# Patient Record
Sex: Female | Born: 1960 | Race: White | Hispanic: No | Marital: Single
Health system: Southern US, Community
[De-identification: ages and names within clinical notes are randomized; demographics above are authoritative.]

---

## 1998-11-29 ENCOUNTER — Other Ambulatory Visit: Admission: RE | Admit: 1998-11-29 | Discharge: 1998-11-29 | Payer: Self-pay | Admitting: Obstetrics and Gynecology

## 2000-02-16 ENCOUNTER — Other Ambulatory Visit: Admission: RE | Admit: 2000-02-16 | Discharge: 2000-02-16 | Payer: Self-pay | Admitting: *Deleted

## 2001-07-24 ENCOUNTER — Other Ambulatory Visit: Admission: RE | Admit: 2001-07-24 | Discharge: 2001-07-24 | Payer: Self-pay | Admitting: Obstetrics and Gynecology

## 2001-11-19 ENCOUNTER — Encounter: Payer: Self-pay | Admitting: Vascular Surgery

## 2001-11-20 ENCOUNTER — Ambulatory Visit (HOSPITAL_COMMUNITY): Admission: RE | Admit: 2001-11-20 | Discharge: 2001-11-20 | Payer: Self-pay | Admitting: Vascular Surgery

## 2001-11-20 ENCOUNTER — Encounter (INDEPENDENT_AMBULATORY_CARE_PROVIDER_SITE_OTHER): Payer: Self-pay | Admitting: *Deleted

## 2002-05-06 ENCOUNTER — Ambulatory Visit (HOSPITAL_COMMUNITY): Admission: RE | Admit: 2002-05-06 | Discharge: 2002-05-06 | Payer: Self-pay | Admitting: Family Medicine

## 2002-05-06 ENCOUNTER — Encounter: Payer: Self-pay | Admitting: Family Medicine

## 2002-10-02 ENCOUNTER — Other Ambulatory Visit: Admission: RE | Admit: 2002-10-02 | Discharge: 2002-10-02 | Payer: Self-pay | Admitting: Obstetrics and Gynecology

## 2003-04-01 ENCOUNTER — Ambulatory Visit (HOSPITAL_COMMUNITY): Admission: RE | Admit: 2003-04-01 | Discharge: 2003-04-01 | Payer: Self-pay | Admitting: Vascular Surgery

## 2003-04-01 ENCOUNTER — Encounter (INDEPENDENT_AMBULATORY_CARE_PROVIDER_SITE_OTHER): Payer: Self-pay | Admitting: *Deleted

## 2008-06-11 ENCOUNTER — Encounter: Admission: RE | Admit: 2008-06-11 | Discharge: 2008-06-11 | Payer: Self-pay | Admitting: Family Medicine

## 2009-11-16 IMAGING — CR DG TIBIA/FIBULA 2V*L*
2 series · 2 of 2 positions shown · non-contrast
Comparison: None

CLINICAL DATA: Injured with a dog leash a week ago with pain

LEFT TIBIA AND FIBULA - 2 VIEW

[view not recorded (1 of 2)]
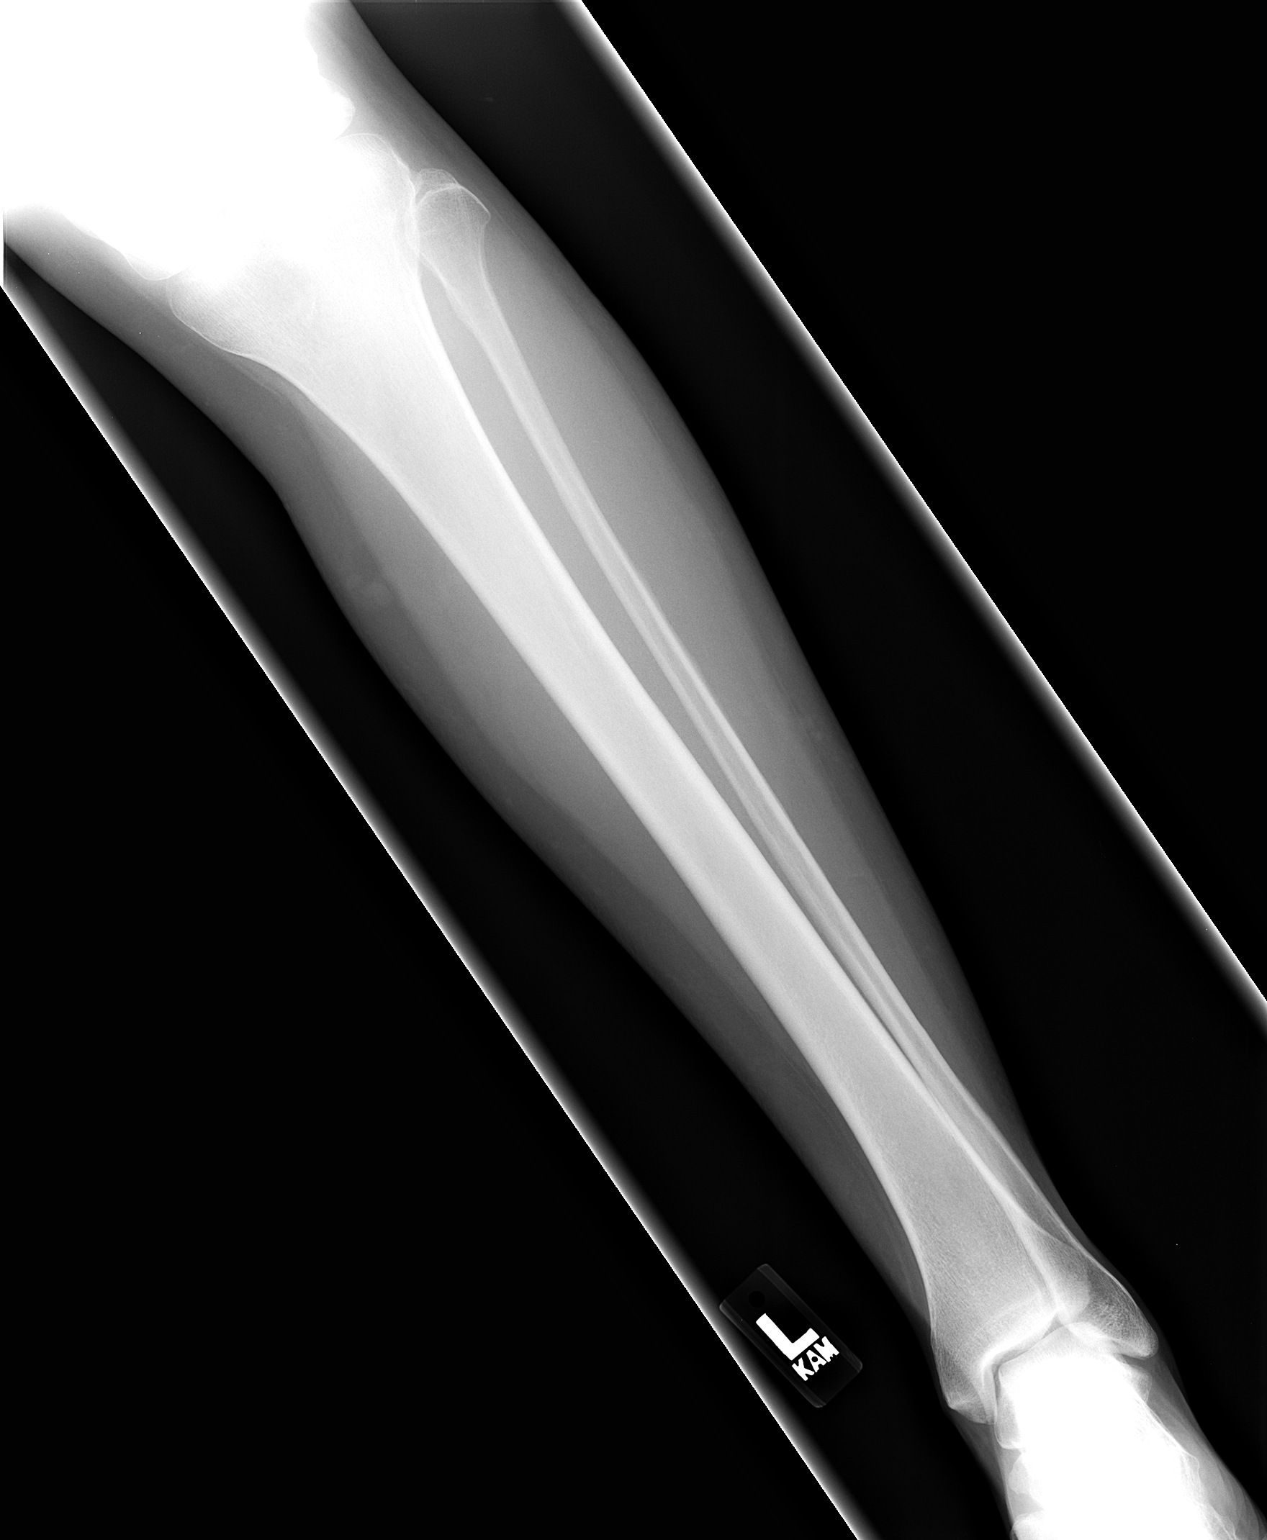

[view not recorded (2 of 2)]
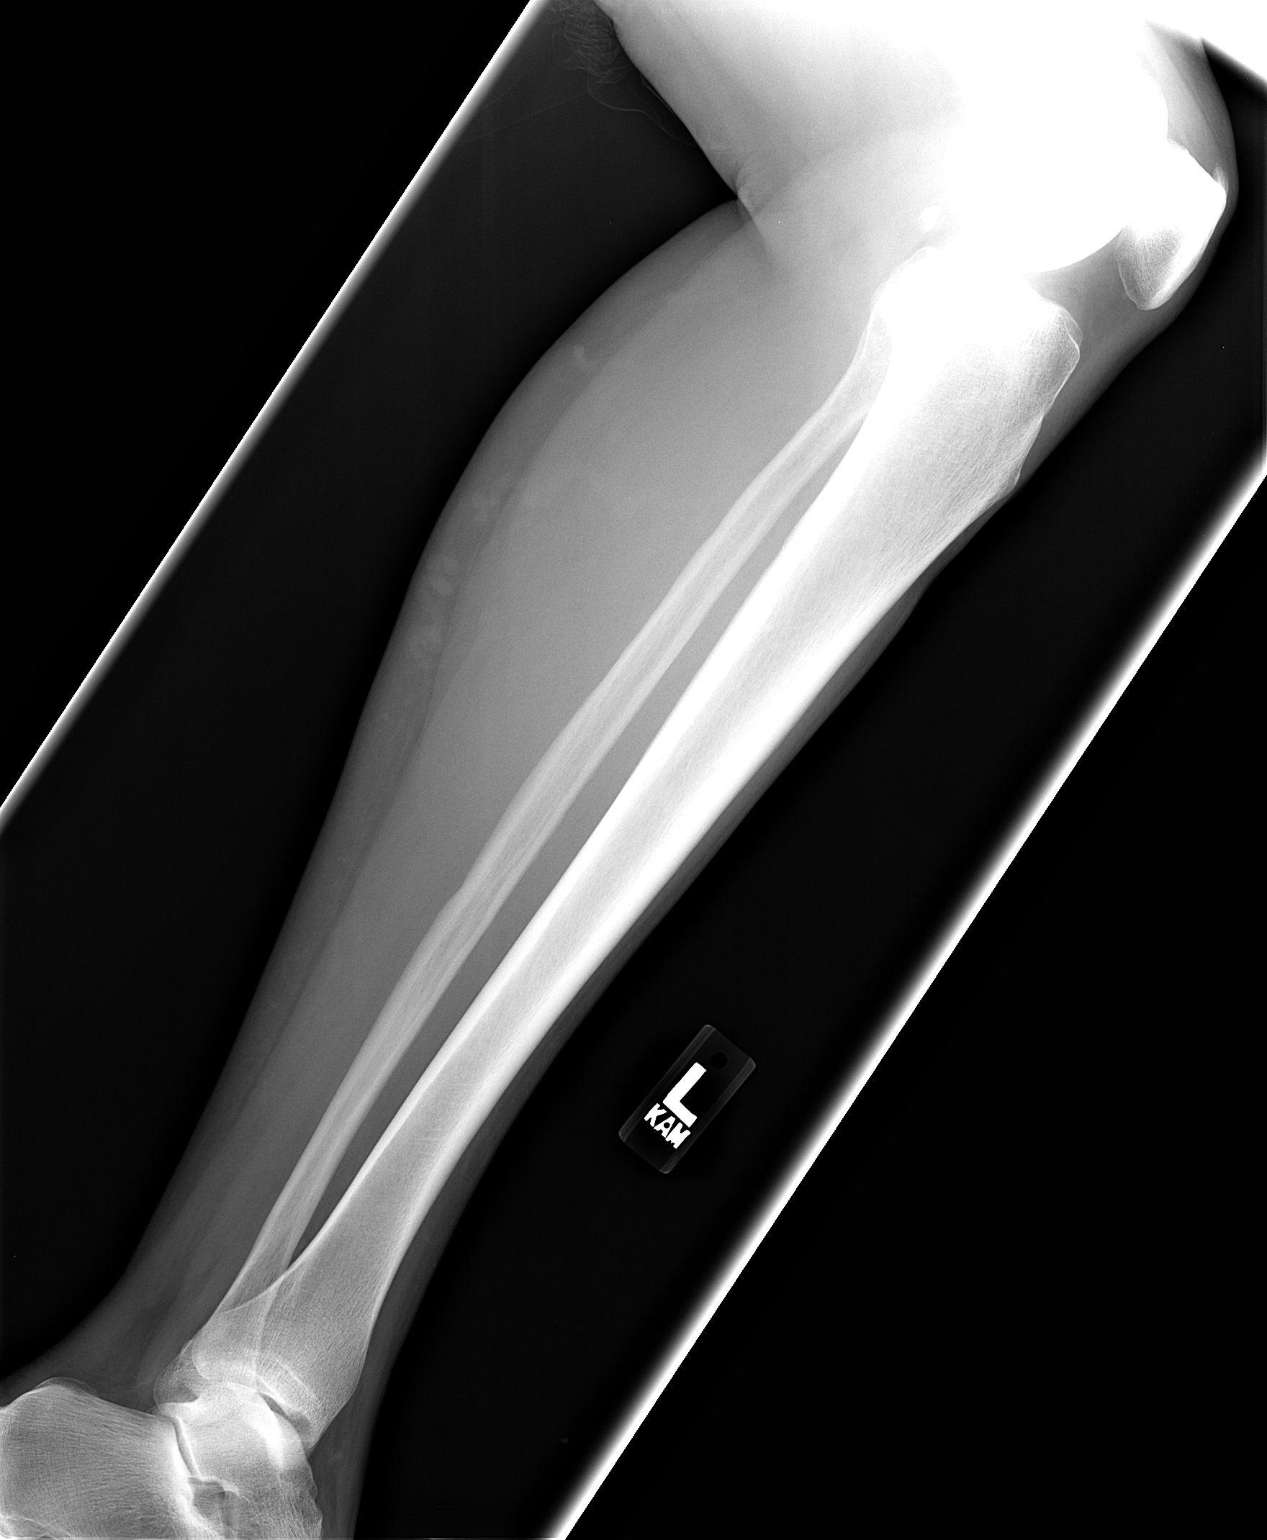

[2 of 2 positions shown; findings below may reference images not displayed]

FINDINGS: No bony abnormality is seen.  Alignment is normal.  No
foreign body is seen and no soft tissue swelling is noted.
IMPRESSION: Negative left tib-fib.

## 2010-11-25 NOTE — Op Note (Signed)
[place]  Patient:    Melanie Ferguson, Melanie Ferguson Visit Number: [id number] MRN: [id number]          Service Type: Attending:  Quita Skye. Hart Rochester, M.D. Dictated by:   Quita Skye Hart Rochester, M.D. Proc. Date: 11/20/01                             Operative Report  PREOPERATIVE DIAGNOSIS:  Severe varicose veins, greater saphenous system, left leg.  POSTOPERATIVE DIAGNOSIS:  Severe varicose veins, greater saphenous system, left leg.  OPERATION: 1. Ligation and stripping of greater saphenous vein, left leg. 2. Excision of multiple varicosities using the Trivex system, left leg.  SURGEON:  Quita Skye. Hart Rochester, M.D.  FIRST ASSISTANT:  Loura Pardon, P.A.  ANESTHESIA:  General endotracheal.  DESCRIPTION OF PROCEDURE:  Patient was taken to the operating room and placed in the upright position, at which time the varicosities in the left leg were marked.  These were mainly at the knee distally, both posteriorly and anteriorly into the pretibial region and posterior calf.  Patient was returned into the Trendelenburg position and satisfactory endotracheal anesthesia was administered.  After prepping and draping the left leg with Betadine scrubbing solution and draped in a sterile manner, an incision was made anterior to the medial malleolus, greater saphenous vein carefully dissected free from the saphenous nerve, ligated distally and the intraluminal stripper passed proximally, where it was palpated at the saphenofemoral junction.  A short oblique incision was made, saphenous vein dissected free, its branches ligated with 3-0 silk ties and it was ligated with a 2-0 silk tie at the saphenofemoral junction.  The vein was transected and the smaller stripper head secured with a 2-0 silk tie.  Following this, the Trivex device was utilized to remove the varicosities below the knee using small stab wounds for the infusion and transillumination port and separate small stab wounds  for the resector; a total of about six incisions were made.  When this was completed, the tunnel was thoroughly irrigated with saline and the leg placed in a Trendelenburg position, compression applied with an Ace wrap and the greater saphenous vein stripped from proximal to distal and 10 minutes of compression applied for hemostasis.  The proximal and distal wounds were closed with Vicryl in a subcuticular fashion.  The small stab wounds were then closed with Steri-Strips and Benzoin, sterile dressing applied consisting of 4 x 4s, Webril and Ace and the patient taken to the recovery room in satisfactory condition. Dictated by:   Quita Skye Hart Rochester, M.D. Attending:  Quita Skye. Hart Rochester, M.D. DD:  11/20/01 TD:  11/21/01 Job: [id number]

## 2010-11-25 NOTE — Op Note (Signed)
   Melanie Ferguson, Melanie Ferguson                        ACCOUNT NO.:  [id number]   MEDICAL RECORD NO.:  [id number]                   PATIENT TYPE:  OIB   LOCATION:  2867                                 FACILITY:  [facility]   PHYSICIAN:  Quita Skye. Hart Rochester, M.D.               DATE OF BIRTH:  [birth year]   DATE OF PROCEDURE:  DATE OF DISCHARGE:  04/01/2003                                 OPERATIVE REPORT   PREOPERATIVE DIAGNOSIS:  Varicose veins, greater saphenous system, right  leg.   POSTOPERATIVE DIAGNOSIS:  Varicose veins, greater saphenous system, right  leg.   OPERATION PERFORMED:  1. Ligation and stripping, greater saphenous vein, right leg.  2. Excision of multiple varicosities, right leg.   SURGEON:  Quita Skye. Hart Rochester, M.D.   ASSISTANT:  Nurse.   ANESTHESIA:  General endotracheal.   DESCRIPTION OF PROCEDURE:  The patient was taken to the operating room and  placed in the upright position at which the varicosities in the right leg  were marked.  These were in the very distal thigh and proximal calf  medially, extending posteriorly to the popliteal fossa.  The patient was  placed in the supine position.  Satisfactory general endotracheal anesthesia  administered. The right leg was prepped with Betadine solution and draped in  routine sterile manner.  An oblique incision was made in the inguinal  crease, saphenofemoral junction dissected free.  Branches ligated with 3-0  silk ties and divided.  The saphenous vein was ligated at the femoral vein  level and an intraluminal stripper passed from proximal to distal to the  proximal calf region in the area of the nest of varicosities.  A short  transverse incision was made at this point and saphenous vein ligated  distally and the small stripper head secured in the inguinal incision.  The  varicosities were then removed using avulsion technique using small stab  wound transverse incisions over these and this was performed using both  avulsion  and dissection maneuvers.  When this was completed, the patient was  placed in Trendelenburg position, the leg wrapped with an Ace and the vein  stripped from proximal to distal.  Compression applied for 10 minutes.  Wounds closed with Vicryl in a subcuticular fashion with Steri-Strips.  Sterile dressing applied.  The patient was then transferred to the recovery  room in satisfactory condition.                                                Quita Skye Hart Rochester, M.D.    JDL/MEDQ  D:  04/01/2003  T:  04/02/2003  Job:  [id number]

## 2015-05-05 ENCOUNTER — Other Ambulatory Visit: Payer: Self-pay

## 2015-05-05 DIAGNOSIS — Z1231 Encounter for screening mammogram for malignant neoplasm of breast: Secondary | ICD-10-CM

## 2015-05-19 ENCOUNTER — Ambulatory Visit: Payer: Self-pay

## 2021-06-16 ENCOUNTER — Other Ambulatory Visit (HOSPITAL_BASED_OUTPATIENT_CLINIC_OR_DEPARTMENT_OTHER): Payer: Self-pay | Admitting: Orthopaedic Surgery

## 2021-06-16 DIAGNOSIS — M25561 Pain in right knee: Secondary | ICD-10-CM

## 2021-06-17 ENCOUNTER — Ambulatory Visit (HOSPITAL_BASED_OUTPATIENT_CLINIC_OR_DEPARTMENT_OTHER)
Admission: RE | Admit: 2021-06-17 | Discharge: 2021-06-17 | Disposition: A | Discharge status: Home / Self Care | Payer: 59 | Source: Ambulatory Visit | Attending: Orthopaedic Surgery | Admitting: Orthopaedic Surgery

## 2021-06-17 ENCOUNTER — Other Ambulatory Visit: Payer: Self-pay

## 2021-06-17 ENCOUNTER — Ambulatory Visit (INDEPENDENT_AMBULATORY_CARE_PROVIDER_SITE_OTHER): Payer: 59 | Admitting: Orthopaedic Surgery

## 2021-06-17 DIAGNOSIS — M25561 Pain in right knee: Secondary | ICD-10-CM

## 2021-06-17 DIAGNOSIS — M7631 Iliotibial band syndrome, right leg: Secondary | ICD-10-CM

## 2021-06-17 NOTE — Progress Notes (Signed)
Chief Complaint: Right knee pain     History of Present Illness:    Melanie Ferguson is a 60 y.o. female presents today for right lateral based knee pain.  She states that she is experiencing pain around this area that is particularly noticeable with stairs as well as when she is lining up pots during golf.  She is a fairly avid golfer and enjoys doing this significantly.  She plays with her husband.  This has been going on for approximately a few months.  She noticed that first thing in the morning she will experience tenderness and bulging about the lateral aspect of the lateral knee.    Surgical History:   None  PMH/PSH/Family History/Social History/Meds/Allergies:   No past medical history on file.  Social History   Socioeconomic History   Marital status: Married    Spouse name: Not on file   Number of children: Not on file   Years of education: Not on file   Highest education level: Not on file  Occupational History   Not on file  Tobacco Use   Smoking status: Not on file   Smokeless tobacco: Not on file  Substance and Sexual Activity   Alcohol use: Not on file   Drug use: Not on file   Sexual activity: Not on file  Other Topics Concern   Not on file  Social History Narrative   Not on file   Social Determinants of Health   Financial Resource Strain: Not on file  Food Insecurity: Not on file  Transportation Needs: Not on file  Physical Activity: Not on file  Stress: Not on file  Social Connections: Not on file   No family history on file. Not on File No current outpatient medications on file.   No current facility-administered medications for this visit.   No results found.  Review of Systems:   A ROS was performed including pertinent positives and negatives as documented in the HPI.  Physical Exam :   Constitutional: NAD and appears stated age Neurological: Alert and oriented Psych: Appropriate affect and  cooperative There were no vitals taken for this visit.   Comprehensive Musculoskeletal Exam:      Musculoskeletal Exam  Gait Normal  Alignment Normal   Right Left  Inspection Normal Normal  Palpation    Tenderness Lateral IT band None  Crepitus None None  Effusion None None  Range of Motion    Extension 0 0  Flexion 135 135  Strength    Extension 5/5 5/5  Flexion 5/5 5/5  Ligament Exam     Generalized Laxity No No  Lachman Negative Negative   Pivot Shift Negative Negative  Anterior Drawer Negative Negative  Valgus at 0 Negative Negative  Valgus at 20 Negative Negative  Varus at 0 0 0  Varus at 20   0 0  Posterior Drawer at 90 0 0  Vascular/Lymphatic Exam    Edema None None  Venous Stasis Changes No No  Distal Circulation Normal Normal  Neurologic    Light Touch Sensation Intact Intact  Special Tests: Positive snapping of the IT band path lateral femoral condyle     Imaging:   Xray (4 views left knee): Normal   I personally reviewed and interpreted the radiographs.   Assessment:   60 year old female with  left snapping IT band in the setting of IT band tightness.  She was advised on a program for rolling out the IT band.  I did also discussed the possibility of a IT band injection which she would like to defer today.  She states that she will follow back up should she want to pursue this if the IT structures are not working.  Plan :    -Return as needed should she want an injection     I personally saw and evaluated the patient, and participated in the management and treatment plan.  Huel Cote, MD Attending Physician, Orthopedic Surgery  This document was dictated using Dragon voice recognition software. A reasonable attempt at proof reading has been made to minimize errors.

## 2021-07-18 DIAGNOSIS — Z85828 Personal history of other malignant neoplasm of skin: Secondary | ICD-10-CM | POA: Diagnosis not present

## 2021-07-18 DIAGNOSIS — D1801 Hemangioma of skin and subcutaneous tissue: Secondary | ICD-10-CM | POA: Diagnosis not present

## 2021-07-18 DIAGNOSIS — L814 Other melanin hyperpigmentation: Secondary | ICD-10-CM | POA: Diagnosis not present

## 2021-07-18 DIAGNOSIS — D2271 Melanocytic nevi of right lower limb, including hip: Secondary | ICD-10-CM | POA: Diagnosis not present

## 2021-07-18 DIAGNOSIS — L308 Other specified dermatitis: Secondary | ICD-10-CM | POA: Diagnosis not present

## 2021-07-18 DIAGNOSIS — L821 Other seborrheic keratosis: Secondary | ICD-10-CM | POA: Diagnosis not present

## 2021-07-18 DIAGNOSIS — D224 Melanocytic nevi of scalp and neck: Secondary | ICD-10-CM | POA: Diagnosis not present

## 2021-07-18 DIAGNOSIS — L2089 Other atopic dermatitis: Secondary | ICD-10-CM | POA: Diagnosis not present

## 2021-09-08 DIAGNOSIS — J029 Acute pharyngitis, unspecified: Secondary | ICD-10-CM | POA: Diagnosis not present

## 2021-09-08 DIAGNOSIS — J020 Streptococcal pharyngitis: Secondary | ICD-10-CM | POA: Diagnosis not present

## 2021-12-19 DIAGNOSIS — J0141 Acute recurrent pansinusitis: Secondary | ICD-10-CM | POA: Diagnosis not present

## 2021-12-20 DIAGNOSIS — H6123 Impacted cerumen, bilateral: Secondary | ICD-10-CM | POA: Diagnosis not present

## 2022-08-24 DIAGNOSIS — J309 Allergic rhinitis, unspecified: Secondary | ICD-10-CM | POA: Diagnosis not present

## 2022-08-24 DIAGNOSIS — Z681 Body mass index (BMI) 19 or less, adult: Secondary | ICD-10-CM | POA: Diagnosis not present

## 2022-08-24 DIAGNOSIS — H66002 Acute suppurative otitis media without spontaneous rupture of ear drum, left ear: Secondary | ICD-10-CM | POA: Diagnosis not present

## 2022-09-20 DIAGNOSIS — D224 Melanocytic nevi of scalp and neck: Secondary | ICD-10-CM | POA: Diagnosis not present

## 2022-09-20 DIAGNOSIS — Z85828 Personal history of other malignant neoplasm of skin: Secondary | ICD-10-CM | POA: Diagnosis not present

## 2022-09-20 DIAGNOSIS — D1801 Hemangioma of skin and subcutaneous tissue: Secondary | ICD-10-CM | POA: Diagnosis not present

## 2022-09-20 DIAGNOSIS — D2271 Melanocytic nevi of right lower limb, including hip: Secondary | ICD-10-CM | POA: Diagnosis not present

## 2022-09-20 DIAGNOSIS — L821 Other seborrheic keratosis: Secondary | ICD-10-CM | POA: Diagnosis not present

## 2022-09-20 DIAGNOSIS — D171 Benign lipomatous neoplasm of skin and subcutaneous tissue of trunk: Secondary | ICD-10-CM | POA: Diagnosis not present

## 2022-09-20 DIAGNOSIS — L2089 Other atopic dermatitis: Secondary | ICD-10-CM | POA: Diagnosis not present

## 2023-01-22 DIAGNOSIS — Z1152 Encounter for screening for COVID-19: Secondary | ICD-10-CM | POA: Diagnosis not present

## 2023-01-22 DIAGNOSIS — Z20822 Contact with and (suspected) exposure to covid-19: Secondary | ICD-10-CM | POA: Diagnosis not present

## 2023-01-22 DIAGNOSIS — Z6820 Body mass index (BMI) 20.0-20.9, adult: Secondary | ICD-10-CM | POA: Diagnosis not present

## 2023-04-02 DIAGNOSIS — L2089 Other atopic dermatitis: Secondary | ICD-10-CM | POA: Diagnosis not present

## 2023-04-02 DIAGNOSIS — Z85828 Personal history of other malignant neoplasm of skin: Secondary | ICD-10-CM | POA: Diagnosis not present
